# Patient Record
Sex: Male | Born: 1995 | Race: White | Hispanic: Yes | Marital: Single | Smoking: Never smoker
Health system: Southern US, Community
[De-identification: ages and names within clinical notes are randomized; demographics above are authoritative.]

## PROBLEM LIST (undated history)

## (undated) DIAGNOSIS — E669 Obesity, unspecified: Secondary | ICD-10-CM

---
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
## 2016-06-03 ENCOUNTER — Emergency Department (HOSPITAL_COMMUNITY): Payer: No Typology Code available for payment source

## 2016-06-03 ENCOUNTER — Encounter (HOSPITAL_COMMUNITY): Payer: Self-pay | Admitting: Emergency Medicine

## 2016-06-03 ENCOUNTER — Emergency Department (HOSPITAL_COMMUNITY)
Admission: EM | Admit: 2016-06-03 | Discharge: 2016-06-04 | Disposition: A | Discharge status: Home / Self Care | Payer: No Typology Code available for payment source | Attending: Emergency Medicine | Admitting: Emergency Medicine

## 2016-06-03 DIAGNOSIS — Y939 Activity, unspecified: Secondary | ICD-10-CM | POA: Insufficient documentation

## 2016-06-03 DIAGNOSIS — S9032XA Contusion of left foot, initial encounter: Secondary | ICD-10-CM | POA: Diagnosis not present

## 2016-06-03 DIAGNOSIS — Y92410 Unspecified street and highway as the place of occurrence of the external cause: Secondary | ICD-10-CM | POA: Diagnosis not present

## 2016-06-03 DIAGNOSIS — Y999 Unspecified external cause status: Secondary | ICD-10-CM | POA: Diagnosis not present

## 2016-06-03 DIAGNOSIS — S99912A Unspecified injury of left ankle, initial encounter: Secondary | ICD-10-CM | POA: Diagnosis present

## 2016-06-03 DIAGNOSIS — S93402A Sprain of unspecified ligament of left ankle, initial encounter: Secondary | ICD-10-CM | POA: Diagnosis not present
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
## 2016-06-04 ENCOUNTER — Emergency Department (HOSPITAL_COMMUNITY): Payer: No Typology Code available for payment source

## 2016-06-04 MED ORDER — NAPROXEN 500 MG PO TABS: 500.0000 mg | ORAL_TABLET | Freq: Two times a day (BID) | ORAL | 0 refills | Status: DC
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
## 2016-06-04 NOTE — ED Notes (Signed)
Pt able to ambulate independently.  Pt up to restroom.  Gait limping, but stable.

## 2016-06-04 NOTE — ED Notes (Signed)
Patient ambulatory to triage. NAD noted.

## 2017-08-01 ENCOUNTER — Emergency Department (HOSPITAL_COMMUNITY)
Admission: EM | Admit: 2017-08-01 | Discharge: 2017-08-01 | Disposition: A | Discharge status: Home / Self Care | Payer: 59 | Attending: Emergency Medicine | Admitting: Emergency Medicine

## 2017-08-01 ENCOUNTER — Encounter (HOSPITAL_COMMUNITY): Payer: Self-pay

## 2017-08-01 DIAGNOSIS — Y999 Unspecified external cause status: Secondary | ICD-10-CM | POA: Diagnosis not present

## 2017-08-01 DIAGNOSIS — Y929 Unspecified place or not applicable: Secondary | ICD-10-CM | POA: Diagnosis not present

## 2017-08-01 DIAGNOSIS — S39012A Strain of muscle, fascia and tendon of lower back, initial encounter: Secondary | ICD-10-CM | POA: Diagnosis not present

## 2017-08-01 DIAGNOSIS — Z79899 Other long term (current) drug therapy: Secondary | ICD-10-CM | POA: Insufficient documentation

## 2017-08-01 DIAGNOSIS — Y9389 Activity, other specified: Secondary | ICD-10-CM | POA: Diagnosis not present

## 2017-08-01 DIAGNOSIS — X509XXA Other and unspecified overexertion or strenuous movements or postures, initial encounter: Secondary | ICD-10-CM | POA: Insufficient documentation

## 2017-08-01 DIAGNOSIS — S3992XA Unspecified injury of lower back, initial encounter: Secondary | ICD-10-CM | POA: Diagnosis present

## 2017-08-01 LAB — URINALYSIS, ROUTINE W REFLEX MICROSCOPIC
Bilirubin Urine: NEGATIVE
GLUCOSE, UA: NEGATIVE mg/dL
HGB URINE DIPSTICK: NEGATIVE
KETONES UR: NEGATIVE mg/dL
Leukocytes, UA: NEGATIVE
Nitrite: NEGATIVE
PROTEIN: NEGATIVE mg/dL
Specific Gravity, Urine: 1.024 (ref 1.005–1.030)
pH: 7 (ref 5.0–8.0)

## 2017-08-01 LAB — CBC
HCT: 44.2 % (ref 39.0–52.0)
Hemoglobin: 14.9 g/dL (ref 13.0–17.0)
MCH: 29 pg (ref 26.0–34.0)
MCHC: 33.7 g/dL (ref 30.0–36.0)
MCV: 86 fL (ref 78.0–100.0)
PLATELETS: 216 10*3/uL (ref 150–400)
RBC: 5.14 MIL/uL (ref 4.22–5.81)
RDW: 12.6 % (ref 11.5–15.5)
WBC: 8.5 10*3/uL (ref 4.0–10.5)

## 2017-08-01 LAB — COMPREHENSIVE METABOLIC PANEL
ALK PHOS: 62 U/L (ref 38–126)
ALT: 16 U/L — AB (ref 17–63)
AST: 22 U/L (ref 15–41)
Albumin: 4 g/dL (ref 3.5–5.0)
Anion gap: 8 (ref 5–15)
BUN: 8 mg/dL (ref 6–20)
CHLORIDE: 106 mmol/L (ref 101–111)
CO2: 23 mmol/L (ref 22–32)
CREATININE: 0.79 mg/dL (ref 0.61–1.24)
Calcium: 9.2 mg/dL (ref 8.9–10.3)
GFR calc Af Amer: >60 mL/min (ref >60)
GFR calc non Af Amer: >60 mL/min (ref >60)
Glucose, Bld: 93 mg/dL (ref 65–99)
Potassium: 3.5 mmol/L (ref 3.5–5.1)
SODIUM: 137 mmol/L (ref 135–145)
Total Bilirubin: 0.5 mg/dL (ref 0.3–1.2)
Total Protein: 7.5 g/dL (ref 6.5–8.1)

## 2017-08-01 LAB — LIPASE, BLOOD: LIPASE: 19 U/L (ref 11–51)

## 2017-08-01 MED ORDER — METHOCARBAMOL 500 MG PO TABS: 500.0000 mg | ORAL_TABLET | Freq: Two times a day (BID) | ORAL | 0 refills | Status: DC

## 2017-08-01 MED ORDER — PREDNISONE 20 MG PO TABS: 40.0000 mg | ORAL_TABLET | Freq: Every day | ORAL | 0 refills | Status: DC

## 2017-08-01 MED ORDER — LIDOCAINE 5 % EX PTCH: 1.0000 | MEDICATED_PATCH | CUTANEOUS | 0 refills | Status: DC

## 2017-08-01 NOTE — ED Triage Notes (Signed)
Pt reports RLQ abdominal pain X2 days. He also reports lower back pain. Pt reports he has also had frequent nose bleeds, twice today. Pt alert and oriented. Denies n/v/d.

## 2017-08-01 NOTE — Discharge Instructions (Signed)
Take prednisone as prescribed for pain as well as Robaxin, as needed. Apply a Lidoderm patch for additional pain control as well. We advise against strenuous activity or heavy lifting for 1 week. Follow up with a sports medicine doctor to ensure resolution of your symptoms. We advise follow up with your primary care doctor as needed.

## 2017-08-01 NOTE — ED Provider Notes (Signed)
[facility] Provider Note   CSN: [id number] Arrival date & time: 08/01/17  1559     History   Chief Complaint Chief Complaint  Patient presents with  . Abdominal Pain    HPI Joseph Boone is a 21 y.o. male.  21 year old male with no significant past medical history presents to the emergency department for evaluation of right low back pain and right groin pain. He states that he has had intermittent, waxing and waning low back pain over the past few months. This was secondary to a fall. He was doing frequent heavy lifting a few days ago and has noticed more constant pain in his back since this time. 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 Pain has been unrelieved with NSAIDs. Back pain is aggravated with movement. He states that he was standing up from the toilet after having a bowel movement and noticed a pain in his right groin as well as in his back. Pain is sharp. The patient denies fever, nausea, vomiting, diarrhea, bowel/bladder incontinence, extremity numbness or paresthesias, inability to ambulate, falls or trauma. No hx of abdominal surgeries.      History reviewed. No pertinent past medical history.  Patient Active Problem List   Diagnosis Date Noted  . Transient alteration of awareness 03/28/2014  . Vasovagal syncope 03/28/2014  . Seizure-like activity (HCC) 03/28/2014  . ENCOPRESIS 12/22/2006    History reviewed. No pertinent surgical history.     Home Medications    Prior to Admission medications   Medication Sig Start Date End Date Taking? Authorizing Provider  ibuprofen (ADVIL,MOTRIN) 200 MG tablet Take 400 mg by mouth every 6 (six) hours as needed for mild pain.   Yes [provider]  naproxen (NAPROSYN) 500 MG tablet Take 1 tablet (500 mg total) by mouth 2 (two) times daily. 06/04/16  Yes [name], NP  lidocaine (LIDODERM) 5 % Place 1 patch onto the skin daily. Remove & Discard patch within 12 hours or as  directed by MD 08/01/17   Antony Madura, PA-C  methocarbamol (ROBAXIN) 500 MG tablet Take 1 tablet (500 mg total) by mouth 2 (two) times daily. 08/01/17   Antony Madura, PA-C  predniSONE (DELTASONE) 20 MG tablet Take 2 tablets (40 mg total) by mouth daily. Take 40 mg by mouth daily for 3 days, then  by mouth daily for 3 days, then  daily for 3 days 08/01/17   Antony Madura, PA-C    Family History History reviewed. No pertinent family history.  Social History Social History  Substance Use Topics  . Smoking status: Never Smoker  . Smokeless tobacco: Never Used  . Alcohol use No     Allergies   Other and Pollen extract   Review of Systems Review of Systems Ten systems reviewed and are negative for acute change, except as noted in the HPI.    Physical Exam Updated Vital Signs BP 130/83 (BP Location: Right Arm)   Pulse 78   Temp 98.3 F (36.8 C) (Oral)   Resp 14   Ht  (1.803 m)   Wt (!) 138.3 kg (305 lb)   SpO2 100%   BMI 42.54 kg/m   Physical Exam  Constitutional: He is oriented to person, place, and time. He appears well-developed and well-nourished. No distress.  Nontoxic and in NAD  HENT:  Head: Normocephalic and atraumatic.  Eyes: Conjunctivae and EOM are normal. No scleral icterus.  Neck: Normal range of motion.  Cardiovascular: Normal  rate, regular rhythm and intact distal pulses.   Pulmonary/Chest: Effort normal. No respiratory distress.  Respirations even and unlabored  Abdominal: Soft. He exhibits no mass. There is no guarding.  Soft, obese abdomen without masses or peritoneal signs. No palpable hernia on exam. No focal tenderness.  Musculoskeletal: Normal range of motion.  Positive straight leg raise and crossed straight leg raise.  Neurological: He is alert and oriented to person, place, and time. He exhibits normal muscle tone. Coordination normal.  GCS 15. Sensation to light touch intact. Patient moving all extremities.  Skin: Skin is warm and  dry. No rash noted. He is not diaphoretic. No erythema. No pallor.  Psychiatric: He has a normal mood and affect. His behavior is normal.  Nursing note and vitals reviewed.    ED Treatments / Results  Labs (all labs ordered are listed, but only abnormal results are displayed) Labs Reviewed  COMPREHENSIVE METABOLIC PANEL - Abnormal; Notable for the following:       Result Value   ALT 16 (*)    All other components within normal limits  LIPASE, BLOOD  CBC  URINALYSIS, ROUTINE W REFLEX MICROSCOPIC    EKG  EKG Interpretation None       Radiology No results found.  Procedures Procedures (including critical care time)  Medications Ordered in ED Medications - No data to display   Initial Impression / Assessment and Plan / ED Course  I have reviewed the triage vital signs and the nursing notes.  Pertinent labs & imaging results that were available during my care of the patient were reviewed by me and considered in my medical decision making (see chart for details).     21 year old male presents to the emergency department for evaluation of back pain with associated right groin pain which began today. Back pain has been chronic over the past few months, but more constant over the past few weeks. Patient neurovascularly intact. No red flags or signs concerning for cauda equina. I do not believe groin pain is secondary to abdominal etiology. Patient has no signs of acute surgical abdomen on exam. No leukocytosis or electrolyte derangements. Liver and kidney function preserved.  I do not believe further emergent workup or imaging is indicated. I have counseled the patient on supportive care and advised against heavy lifting. Patient referred to sports medicine for follow-up. Return precautions discussed and provided. Patient discharged in stable condition with no unaddressed concerns.   Final Clinical Impressions(s) / ED Diagnoses   Final diagnoses:  Low back strain, initial  encounter    New Prescriptions New Prescriptions   LIDOCAINE (LIDODERM) 5 %    Place 1 patch onto the skin daily. Remove & Discard patch within 12 hours or as directed by MD   METHOCARBAMOL (ROBAXIN) 500 MG TABLET    Take 1 tablet (500 mg total) by mouth 2 (two) times daily.   PREDNISONE (DELTASONE) 20 MG TABLET    Take 2 tablets (40 mg total) by mouth daily. Take 40 mg by mouth daily for 3 days, then  by mouth daily for 3 days, then  daily for 3 days     Antony Madura, Cordelia Poche 08/01/17 2234    Linwood Dibbles, MD 08/03/17 [phone]

## 2017-08-01 NOTE — ED Notes (Addendum)
Pt reports RLQ pain and R and L sided back pain x 2-3 days. Pt denies N/V/D. Reports he was having a BM this AM, and when he stood up the pain became much worse. Denies urinary sx. Ambulatory to room with steady gait.

## 2017-08-01 NOTE — ED Notes (Signed)
ED Provider at bedside. 

## 2017-08-31 ENCOUNTER — Emergency Department (HOSPITAL_COMMUNITY)
Admission: EM | Admit: 2017-08-31 | Discharge: 2017-09-01 | Disposition: A | Discharge status: Home / Self Care | Payer: 59 | Attending: Emergency Medicine | Admitting: Emergency Medicine

## 2017-08-31 ENCOUNTER — Other Ambulatory Visit: Payer: Self-pay

## 2017-08-31 ENCOUNTER — Encounter (HOSPITAL_COMMUNITY): Payer: Self-pay | Admitting: *Deleted

## 2017-08-31 DIAGNOSIS — J029 Acute pharyngitis, unspecified: Secondary | ICD-10-CM | POA: Insufficient documentation

## 2017-08-31 DIAGNOSIS — H9202 Otalgia, left ear: Secondary | ICD-10-CM | POA: Diagnosis present

## 2017-08-31 DIAGNOSIS — H66002 Acute suppurative otitis media without spontaneous rupture of ear drum, left ear: Secondary | ICD-10-CM | POA: Diagnosis not present

## 2017-08-31 DIAGNOSIS — Z79899 Other long term (current) drug therapy: Secondary | ICD-10-CM | POA: Insufficient documentation

## 2017-08-31 HISTORY — DX: Obesity, unspecified: E66.9

## 2017-08-31 MED ORDER — ACETAMINOPHEN 325 MG PO TABS
650.0000 mg | ORAL_TABLET | Freq: Once | ORAL | Status: AC | PRN
  Administered 2017-08-31: 650 mg via ORAL

## 2017-08-31 MED ORDER — ACETAMINOPHEN 325 MG PO TABS
ORAL_TABLET | ORAL | Status: AC
  Filled 2017-08-31: qty 2

## 2017-08-31 MED ORDER — IBUPROFEN 800 MG PO TABS
800.0000 mg | ORAL_TABLET | Freq: Once | ORAL | Status: AC
  Administered 2017-09-01: 800 mg via ORAL
  Filled 2017-08-31: qty 1

## 2017-08-31 MED ORDER — AMOXICILLIN 500 MG PO CAPS: 500.0000 mg | ORAL_CAPSULE | Freq: Three times a day (TID) | ORAL | 0 refills | Status: DC

## 2017-08-31 NOTE — ED Triage Notes (Signed)
Pt reports having left ear pain since yesterday. Feels like he has swelling down into left side of his neck and reports bloody drainage from his ear last night. Febrile at triage.

## 2017-08-31 NOTE — Discharge Instructions (Signed)
Return if any problems.

## 2017-08-31 NOTE — ED Provider Notes (Signed)
[facility]   CSN: [id number] Arrival date & time: 08/31/17  1511     History   Chief Complaint Chief Complaint  Patient presents with  . Otalgia    HPI Joseph Boone is a 21 y.o. male.  The history is provided by the patient. No language interpreter was used.  Otalgia  This is a new problem. The current episode started yesterday. There is pain in the left ear. The problem occurs constantly. The problem has been gradually worsening. The maximum temperature recorded prior to his arrival was 101 to 101.9 F. The fever has been present for 1 to 2 days. The pain is moderate. Associated symptoms include sore throat.  Pt complains of a sore throat and left ear pain  Past Medical History:  Diagnosis Date  . Obesity     Patient Active Problem List   Diagnosis Date Noted  . Transient alteration of awareness 03/28/2014  . Vasovagal syncope 03/28/2014  . Seizure-like activity (HCC) 03/28/2014  . ENCOPRESIS 12/22/2006    History reviewed. No pertinent surgical history.     Home Medications    Prior to Admission medications   Medication Sig Start Date End Date Taking? Authorizing Provider  ibuprofen (ADVIL,MOTRIN) 200 MG tablet Take 400 mg by mouth every 6 (six) hours as needed for mild pain.   Yes [provider]  methocarbamol (ROBAXIN) 500 MG tablet Take 1 tablet (500 mg total) by mouth 2 (two) times daily. 08/01/17  Yes Antony MaduraHumes, Kelly, PA-C  lidocaine (LIDODERM) 5 % Place 1 patch onto the skin daily. Remove & Discard patch within 12 hours or as directed by MD Patient not taking: Reported on 08/31/2017 08/01/17   Antony MaduraHumes, Kelly, PA-C  naproxen (NAPROSYN) 500 MG tablet Take 1 tablet (500 mg total) by mouth 2 (two) times daily. Patient not taking: Reported on 08/31/2017 06/04/16   Felicie MornSmith, David, NP  predniSONE (DELTASONE) 20 MG tablet Take 2 tablets (40 mg total) by mouth daily. Take 40 mg by mouth daily for 3 days, then  20mg  by mouth daily for 3 days, then 10mg  daily for 3 days Patient not taking: Reported on 08/31/2017 08/01/17   Antony MaduraHumes, Kelly, PA-C    Family History History reviewed. No pertinent family history.  Social History Social History   Tobacco Use  . Smoking status: Never Smoker  . Smokeless tobacco: Never Used  Substance Use Topics  . Alcohol use: No  . Drug use: No     Allergies   Other; Pollen extract; and Tylenol [acetaminophen]   Review of Systems Review of Systems  HENT: Positive for ear pain and sore throat.   All other systems reviewed and are negative.    Physical Exam Updated Vital Signs BP 127/85 (BP Location: Right Arm)   Pulse (!) 102   Temp 99.4 F (37.4 C) (Oral)   Resp 18   SpO2 100%   Physical Exam  Constitutional: He appears well-developed and well-nourished.  HENT:  Head: Normocephalic and atraumatic.  Nose: Nose normal.  Mouth/Throat: Oropharynx is clear and moist.  Left tm erythematous,  Throat erythematous   Eyes: Conjunctivae are normal.  Neck: Neck supple.  Cardiovascular: Normal rate and regular rhythm.  No murmur heard. Pulmonary/Chest: Effort normal and breath sounds normal. No respiratory distress.  Abdominal: Soft. There is no tenderness.  Musculoskeletal: He exhibits no edema.  Neurological: He is alert.  Skin: Skin is warm and dry.  Psychiatric: He has a normal mood and affect.  Nursing note and vitals reviewed.    ED Treatments / Results  Labs (all labs ordered are listed, but only abnormal results are displayed) Labs Reviewed - No data to display  EKG  EKG Interpretation None       Radiology No results found.  Procedures Procedures (including critical care time)  Medications Ordered in ED Medications  acetaminophen (TYLENOL) 325 MG tablet (not administered)  acetaminophen (TYLENOL) tablet 650 mg (650 mg Oral Given 08/31/17 1548)     Initial Impression / Assessment and Plan / ED Course  I have reviewed the  triage vital signs and the nursing notes.  Pertinent labs & imaging results that were available during my care of the patient were reviewed by me and considered in my medical decision making (see chart for details).       Final Clinical Impressions(s) / ED Diagnoses   Final diagnoses:  Acute suppurative otitis media of left ear without spontaneous rupture of tympanic membrane, recurrence not specified  Pharyngitis, unspecified etiology    ED Discharge Orders    None    An After Visit Summary was printed and given to the patient.    Elson AreasSofia, Makina Skow K, PA-C 08/31/17 2320    Donnetta Hutchingook, Brian, MD 09/02/17 1343

## 2017-09-01 NOTE — ED Notes (Signed)
Pt verbalized understanding of discharge paperwork.

## 2020-03-12 ENCOUNTER — Ambulatory Visit: Payer: Self-pay | Admitting: Adult Health

## 2020-05-28 ENCOUNTER — Other Ambulatory Visit: Payer: Self-pay

## 2020-05-28 ENCOUNTER — Ambulatory Visit: Payer: 59 | Admitting: Adult Health

## 2020-08-14 DIAGNOSIS — R079 Chest pain, unspecified: Secondary | ICD-10-CM | POA: Diagnosis not present

## 2021-06-18 ENCOUNTER — Other Ambulatory Visit: Payer: Self-pay

## 2021-06-18 ENCOUNTER — Emergency Department (HOSPITAL_COMMUNITY)
Admission: EM | Admit: 2021-06-18 | Discharge: 2021-06-18 | Disposition: A | Discharge status: Home / Self Care | Payer: Medicaid Other | Attending: Emergency Medicine | Admitting: Emergency Medicine

## 2021-06-18 DIAGNOSIS — Y9389 Activity, other specified: Secondary | ICD-10-CM | POA: Insufficient documentation

## 2021-06-18 DIAGNOSIS — S60943A Unspecified superficial injury of left middle finger, initial encounter: Secondary | ICD-10-CM | POA: Diagnosis present

## 2021-06-18 DIAGNOSIS — Y9289 Other specified places as the place of occurrence of the external cause: Secondary | ICD-10-CM | POA: Diagnosis not present

## 2021-06-18 DIAGNOSIS — S61213A Laceration without foreign body of left middle finger without damage to nail, initial encounter: Secondary | ICD-10-CM | POA: Diagnosis not present

## 2021-06-18 DIAGNOSIS — Z23 Encounter for immunization: Secondary | ICD-10-CM | POA: Diagnosis not present

## 2021-06-18 DIAGNOSIS — W268XXA Contact with other sharp object(s), not elsewhere classified, initial encounter: Secondary | ICD-10-CM | POA: Diagnosis not present

## 2021-06-18 MED ORDER — TETANUS-DIPHTH-ACELL PERTUSSIS 5-2.5-18.5 LF-MCG/0.5 IM SUSY
0.5000 mL | PREFILLED_SYRINGE | Freq: Once | INTRAMUSCULAR | Status: AC
  Administered 2021-06-18: 0.5 mL via INTRAMUSCULAR
  Filled 2021-06-18: qty 0.5

## 2021-06-18 NOTE — ED Provider Notes (Signed)
Emergency Medicine Provider Triage Evaluation Note  Thadius Smisek , a 25 y.o. male  was evaluated in triage.  Pt complains of finger lac.  Review of Systems  Positive: Lac to L middle finger Negative: numbness  Physical Exam  BP 125/81 (BP Location: Right Arm)   Pulse (!) 111   Temp 98.1 F (36.7 C) (Oral)   Resp 18   SpO2 96%  Gen:   Awake, no distress   Resp:  Normal effort  MSK:   Moves extremities without difficulty  Other:  L middle finger: avulsion of skin to medial distal digit, actively bleeding, minimal nail involvement, no joint involvement.   Medical Decision Making  Medically screening exam initiated at 7:12 PM.  Appropriate orders placed.  Hayward Rylander was informed that the remainder of the evaluation will be completed by another provider, this initial triage assessment does not replace that evaluation, and the importance of remaining in the ED until their evaluation is complete.  Pt was cutting guacamole at work when he accidentally sliced a portion of the skin in his left middle finger.  Did tried cleaning it with hydrogen peroxide and use liquid bandaid but unable to provide adequate hemostasis.  No numbness. Does not recall last tdap.   Fayrene Helper, PA-C 06/18/21 1914    Koleen Distance, MD 06/18/21 [phone]

## 2021-06-18 NOTE — ED Provider Notes (Signed)
[facility] EMERGENCY DEPARTMENT Provider Note   CSN: [id number] Arrival date & time: 06/18/21  1903     History Chief Complaint  Patient presents with   Laceration    R middle    Joseph Boone is a 25 y.o. male.  Left-handed.  The history is provided by the patient.  Laceration Location:  Finger Finger laceration location:  L middle finger Length:  1 cm Depth:  Cutaneous Quality: avulsion   Bleeding: controlled   Time since incident:  2 hours Laceration mechanism:  Knife (cutting jalapeno at restaurant) Pain details:    Quality:  Throbbing   Severity:  Mild   Timing:  Constant   Progression:  Unchanged Foreign body present:  No foreign bodies Relieved by:  Pressure Worsened by:  Nothing Ineffective treatments:  None tried Tetanus status:  Out of date Associated symptoms: no fever, no focal weakness, no numbness and no rash       Past Medical History:  Diagnosis Date   Obesity     Patient Active Problem List   Diagnosis Date Noted   Transient alteration of awareness 03/28/2014   Vasovagal syncope 03/28/2014   Seizure-like activity (HCC) 03/28/2014   ENCOPRESIS 12/22/2006    No past surgical history on file.     No family history on file.  Social History   Tobacco Use   Smoking status: Never   Smokeless tobacco: Never  Substance Use Topics   Alcohol use: No   Drug use: No    Home Medications Prior to Admission medications   Medication Sig Start Date End Date Taking? Authorizing Provider  amoxicillin (AMOXIL) 500 MG capsule Take 1 capsule (500 mg total) 3 (three) times daily by mouth. 08/31/17   Elson Areas, PA-C  ibuprofen (ADVIL,MOTRIN) 200 MG tablet Take 400 mg by mouth every 6 (six) hours as needed for mild pain.    [provider]  lidocaine (LIDODERM) 5 % Place 1 patch onto the skin daily. Remove & Discard patch within 12 hours or as directed by MD Patient not taking: Reported on 08/31/2017 08/01/17    Antony Madura, PA-C  methocarbamol (ROBAXIN) 500 MG tablet Take 1 tablet (500 mg total) by mouth 2 (two) times daily. 08/01/17   Antony Madura, PA-C  naproxen (NAPROSYN) 500 MG tablet Take 1 tablet (500 mg total) by mouth 2 (two) times daily. Patient not taking: Reported on 08/31/2017 06/04/16   [name], NP  predniSONE (DELTASONE) 20 MG tablet Take 2 tablets (40 mg total) by mouth daily. Take 40 mg by mouth daily for 3 days, then 20mg  by mouth daily for 3 days, then 10mg  daily for 3 days Patient not taking: Reported on 08/31/2017 08/01/17   13/04/2017, PA-C    Allergies    Other, Pollen extract, and Tylenol [acetaminophen]  Review of Systems   Review of Systems  Constitutional:  Negative for chills and fever.  HENT:  Negative for ear pain and sore throat.   Eyes:  Negative for pain and visual disturbance.  Respiratory:  Negative for cough and shortness of breath.   Cardiovascular:  Negative for chest pain and palpitations.  Gastrointestinal:  Negative for abdominal pain and vomiting.  Genitourinary:  Negative for dysuria and hematuria.  Musculoskeletal:  Negative for arthralgias and back pain.  Skin:  Positive for wound. Negative for color change and rash.  Neurological:  Negative for focal weakness, seizures and syncope.  All other systems reviewed and are negative.  Physical Exam  Updated Vital Signs BP 125/81 (BP Location: Right Arm)   Pulse (!) 111   Temp 98.1 F (36.7 C) (Oral)   Resp 18   SpO2 96%   Physical Exam Vitals and nursing note reviewed.  Constitutional:      Appearance: Normal appearance.  HENT:     Head: Normocephalic and atraumatic.  Eyes:     Conjunctiva/sclera: Conjunctivae normal.  Pulmonary:     Effort: Pulmonary effort is normal. No respiratory distress.  Musculoskeletal:        General: No deformity. Normal range of motion.     Cervical back: Normal range of motion.  Skin:    General: Skin is warm and dry.     Comments: There is a jagged  avulsion on the radial aspect of the distal phalanx of the left third finger.  The wound was hemostatic at the moment.  No decrease in sensation to light touch.  Capillary refill is normal.  Tendon function is normal.  Neurological:     General: No focal deficit present.     Mental Status: He is alert and oriented to person, place, and time. Mental status is at baseline.  Psychiatric:        Mood and Affect: Mood normal.    ED Results / Procedures / Treatments   Labs (all labs ordered are listed, but only abnormal results are displayed) Labs Reviewed - No data to display  EKG None  Radiology No results found.  Procedures Procedures   Medications Ordered in ED Medications  Tdap (BOOSTRIX) injection 0.5 mL (has no administration in time range)    ED Course  I have reviewed the triage vital signs and the nursing notes.  Pertinent labs & imaging results that were available during my care of the patient were reviewed by me and considered in my medical decision making (see chart for details).    MDM Rules/Calculators/A&P                           Joseph Boone presents after lacerating a finger while cutting vegetables.  The wound is hemostatic.  It is not amenable to suture repair given its avulsion characteristic.  He was given wound care instructions as well as a tetanus injection. Final Clinical Impression(s) / ED Diagnoses Final diagnoses:  Laceration of left middle finger without foreign body without damage to nail, initial encounter    Rx / DC Orders ED Discharge Orders     None        Koleen Distance, MD 06/18/21 2126

## 2021-06-18 NOTE — ED Triage Notes (Signed)
Pt cut L middle finger while cutting food, unsure of last tetanus shot. Hydrogen peroxide & liquid bandaid on cut PTA. Bleeding controlled in triage, fresh bandage applied

## 2021-08-20 ENCOUNTER — Other Ambulatory Visit: Payer: Self-pay

## 2021-08-20 ENCOUNTER — Emergency Department (HOSPITAL_COMMUNITY): Payer: Medicaid Other

## 2021-08-20 ENCOUNTER — Emergency Department (HOSPITAL_COMMUNITY)
Admission: EM | Admit: 2021-08-20 | Discharge: 2021-08-21 | Disposition: A | Discharge status: Home / Self Care | Payer: Medicaid Other | Attending: Emergency Medicine | Admitting: Emergency Medicine

## 2021-08-20 ENCOUNTER — Encounter (HOSPITAL_COMMUNITY): Payer: Self-pay

## 2021-08-20 DIAGNOSIS — R059 Cough, unspecified: Secondary | ICD-10-CM | POA: Diagnosis present

## 2021-08-20 DIAGNOSIS — Z20822 Contact with and (suspected) exposure to covid-19: Secondary | ICD-10-CM | POA: Diagnosis not present

## 2021-08-20 DIAGNOSIS — J101 Influenza due to other identified influenza virus with other respiratory manifestations: Secondary | ICD-10-CM | POA: Insufficient documentation

## 2021-08-20 LAB — BASIC METABOLIC PANEL
Anion gap: 9 (ref 5–15)
BUN: 11 mg/dL (ref 6–20)
CO2: 24 mmol/L (ref 22–32)
Calcium: 9.1 mg/dL (ref 8.9–10.3)
Chloride: 102 mmol/L (ref 98–111)
Creatinine, Ser: 1.06 mg/dL (ref 0.61–1.24)
GFR, Estimated: >60 mL/min (ref >60)
Glucose, Bld: 91 mg/dL (ref 70–99)
Potassium: 3.4 mmol/L — ABNORMAL LOW (ref 3.5–5.1)
Sodium: 135 mmol/L (ref 135–145)

## 2021-08-20 LAB — CBC WITH DIFFERENTIAL/PLATELET
Abs Immature Granulocytes: 0.03 10*3/uL (ref 0.00–0.07)
Basophils Absolute: 0 10*3/uL (ref 0.0–0.1)
Basophils Relative: 0 %
Eosinophils Absolute: 0 10*3/uL (ref 0.0–0.5)
Eosinophils Relative: 0 %
HCT: 47.8 % (ref 39.0–52.0)
Hemoglobin: 16.2 g/dL (ref 13.0–17.0)
Immature Granulocytes: 0 %
Lymphocytes Relative: 20 %
Lymphs Abs: 1.4 10*3/uL (ref 0.7–4.0)
MCH: 29 pg (ref 26.0–34.0)
MCHC: 33.9 g/dL (ref 30.0–36.0)
MCV: 85.7 fL (ref 80.0–100.0)
Monocytes Absolute: 0.9 10*3/uL (ref 0.1–1.0)
Monocytes Relative: 13 %
Neutro Abs: 4.7 10*3/uL (ref 1.7–7.7)
Neutrophils Relative %: 67 %
Platelets: 207 10*3/uL (ref 150–400)
RBC: 5.58 MIL/uL (ref 4.22–5.81)
RDW: 12.4 % (ref 11.5–15.5)
WBC: 7.1 10*3/uL (ref 4.0–10.5)
nRBC: 0 % (ref 0.0–0.2)

## 2021-08-20 LAB — RESP PANEL BY RT-PCR (FLU A&B, COVID) ARPGX2
Influenza A by PCR: POSITIVE — AB
Influenza B by PCR: NEGATIVE
SARS Coronavirus 2 by RT PCR: NEGATIVE

## 2021-08-20 LAB — TROPONIN I (HIGH SENSITIVITY)
Troponin I (High Sensitivity): <2 ng/L (ref <18)
Troponin I (High Sensitivity): <2 ng/L (ref <18)

## 2021-08-20 NOTE — ED Triage Notes (Signed)
Pt here POV with c/o of cold symptoms. Body aches, cold sweats, cough, fever at home.

## 2021-08-20 NOTE — ED Provider Notes (Signed)
Emergency Medicine Provider Triage Evaluation Note  Joseph Boone , Boone 25 y.o. male  was evaluated in triage.  Pt complains of cough, chest pain.  Chest pain no exertional nature.  He has had some myalgias, fever at home.  He wants make sure he does not COVID.  He previously had COVID need to be admission and told he had "inflammation and enlarged heart."  States he needs to be on medication for this.  Denies any prior history of PE or DVT.  Usually has shortness of breath however none currently.  No lower extremity edema.  Review of Systems  Positive: Chest pain, cough Negative: Fever, emesis  Physical Exam  BP 126/87 (BP Location: Left Arm)   Pulse 87   Temp 98.9 F (37.2 C) (Oral)   Resp 18   SpO2 99%  Gen:   Awake, no distress   Resp:  Normal effort  MSK:   Moves extremities without difficulty  Other:    Medical Decision Making  Medically screening exam initiated at 5:44 PM.  Appropriate orders placed.  Joseph Boone was informed that the remainder of the evaluation will be completed by another provider, this initial triage assessment does not replace that evaluation, and the importance of remaining in the ED until their evaluation is complete.  URI and CP   Joseph Laiche A, PA-C 08/20/21 1745    Joseph Bucco, MD 08/20/21 6333

## 2021-08-21 NOTE — Discharge Instructions (Signed)
You were evaluated in the Emergency Department and after careful evaluation, we did not find any emergent condition requiring admission or further testing in the hospital.  Your exam/testing today was overall reassuring.  Symptoms seem to be due to the flu.  Recommend plenty of fluids at home, Tylenol or Motrin for discomfort.  Should be feeling better in a few days.  Please return to the Emergency Department if you experience any worsening of your condition.  Thank you for allowing [place] to be a part of your care.

## 2021-08-21 NOTE — ED Provider Notes (Signed)
[facility]EMERGENCY DEPT [facility] Emergency Department Provider Note MRN:  [id number]  Arrival date & time: 08/21/21     Chief Complaint   URI History of Present Illness   Joseph Boone is a 25 y.o. year-old male with a history of obesity presenting to the ED with chief complaint of URI.  Cough, nasal congestion, mild sore throat.  Chest discomfort during cough occasionally.  Symptoms for the past 2 or 3 days.  Denies shortness of breath, no abdominal pain, no nausea vomiting or diarrhea.  No syncopal episodes.  Symptoms are constant, mild to moderate, no exacerbating or alleviating factors.  Review of Systems  A complete 10 system review of systems was obtained and all systems are negative except as noted in the HPI and PMH.   Patient's Health History    Past Medical History:  Diagnosis Date   Obesity     History reviewed. No pertinent surgical history.  History reviewed. No pertinent family history.  Social History   Socioeconomic History   Marital status: Single    Spouse name: Not on file   Number of children: Not on file   Years of education: Not on file   Highest education level: Not on file  Occupational History   Not on file  Tobacco Use   Smoking status: Never   Smokeless tobacco: Never  Substance and Sexual Activity   Alcohol use: No   Drug use: No   Sexual activity: Never    Birth control/protection: Abstinence  Other Topics Concern   Not on file  Social History Narrative   Not on file   Social Determinants of Health   Financial Resource Strain: Not on file  Food Insecurity: Not on file  Transportation Needs: Not on file  Physical Activity: Not on file  Stress: Not on file  Social Connections: Not on file  Intimate Partner Violence: Not on file     Physical Exam   Vitals:   08/21/21 0245 08/21/21 0348  BP: 117/70 109/74  Pulse: 94 83  Resp: 20 15  Temp: 100 F (37.8 C) (!) 97.1 F (36.2 C)  SpO2: 99% 98%    CONSTITUTIONAL:  Well-appearing, NAD NEURO:  Alert and oriented x 3, no focal deficits EYES:  eyes equal and reactive ENT/NECK:  no LAD, no JVD CARDIO: Regular rate, well-perfused, normal S1 and S2 PULM:  CTAB no wheezing or rhonchi GI/GU:  normal bowel sounds, non-distended, non-tender MSK/SPINE:  No gross deformities, no edema SKIN:  no rash, atraumatic PSYCH:  Appropriate speech and behavior  *Additional and/or pertinent findings included in MDM below  Diagnostic and Interventional Summary    EKG Interpretation  Date/Time:  Friday August 21 2021 03:46:46 EDT Ventricular Rate:  87 PR Interval:  152 QRS Duration: 105 QT Interval:  388 QTC Calculation: 467 R Axis:   87 Text Interpretation: Sinus rhythm Confirmed by Kennis Carina [id number]) on 08/21/2021 3:52:18 AM       Labs Reviewed  RESP PANEL BY RT-PCR (FLU A&B, COVID) ARPGX2 - Abnormal; Notable for the following components:      Result Value   Influenza A by PCR POSITIVE (*)    All other components within normal limits  BASIC METABOLIC PANEL - Abnormal; Notable for the following components:   Potassium 3.4 (*)    All other components within normal limits  CBC WITH DIFFERENTIAL/PLATELET  TROPONIN I (HIGH SENSITIVITY)  TROPONIN I (HIGH SENSITIVITY)    DG Chest 2 View  Final Result  Medications - No data to display   Procedures  /  Critical Care Procedures  ED Course and Medical Decision Making  I have reviewed the triage vital signs, the nursing notes, and pertinent available records from the EMR.  Listed above are laboratory and imaging tests that I personally ordered, reviewed, and interpreted and then considered in my medical decision making (see below for details).  URI symptoms well explained by flu.  Tested positive for influenza A.  No increased work of breathing, vital signs reassuring, appropriate for discharge with symptomatic management at home.       Elmer Sow. Pilar Plate, MD [facility] Emergency Medicine [facility] [email or web address]  Final Clinical Impressions(s) / ED Diagnoses     ICD-10-CM   1. Influenza A  J10.1       ED Discharge Orders     None        Discharge Instructions Discussed with and Provided to Patient:    Discharge Instructions      You were evaluated in the Emergency Department and after careful evaluation, we did not find any emergent condition requiring admission or further testing in the hospital.  Your exam/testing today was overall reassuring.  Symptoms seem to be due to the flu.  Recommend plenty of fluids at home, Tylenol or Motrin for discomfort.  Should be feeling better in a few days.  Please return to the Emergency Department if you experience any worsening of your condition.  Thank you for allowing [place] to be a part of your care.        Sabas Sous, MD 08/21/21 [phone]

## 2022-04-05 IMAGING — CR DG CHEST 2V
2 series · 2 of 2 positions shown · non-contrast
Comparison: 01/26/2021

CLINICAL DATA: Chest pain cough

EXAM:
CHEST - 2 VIEW

[chest lat]
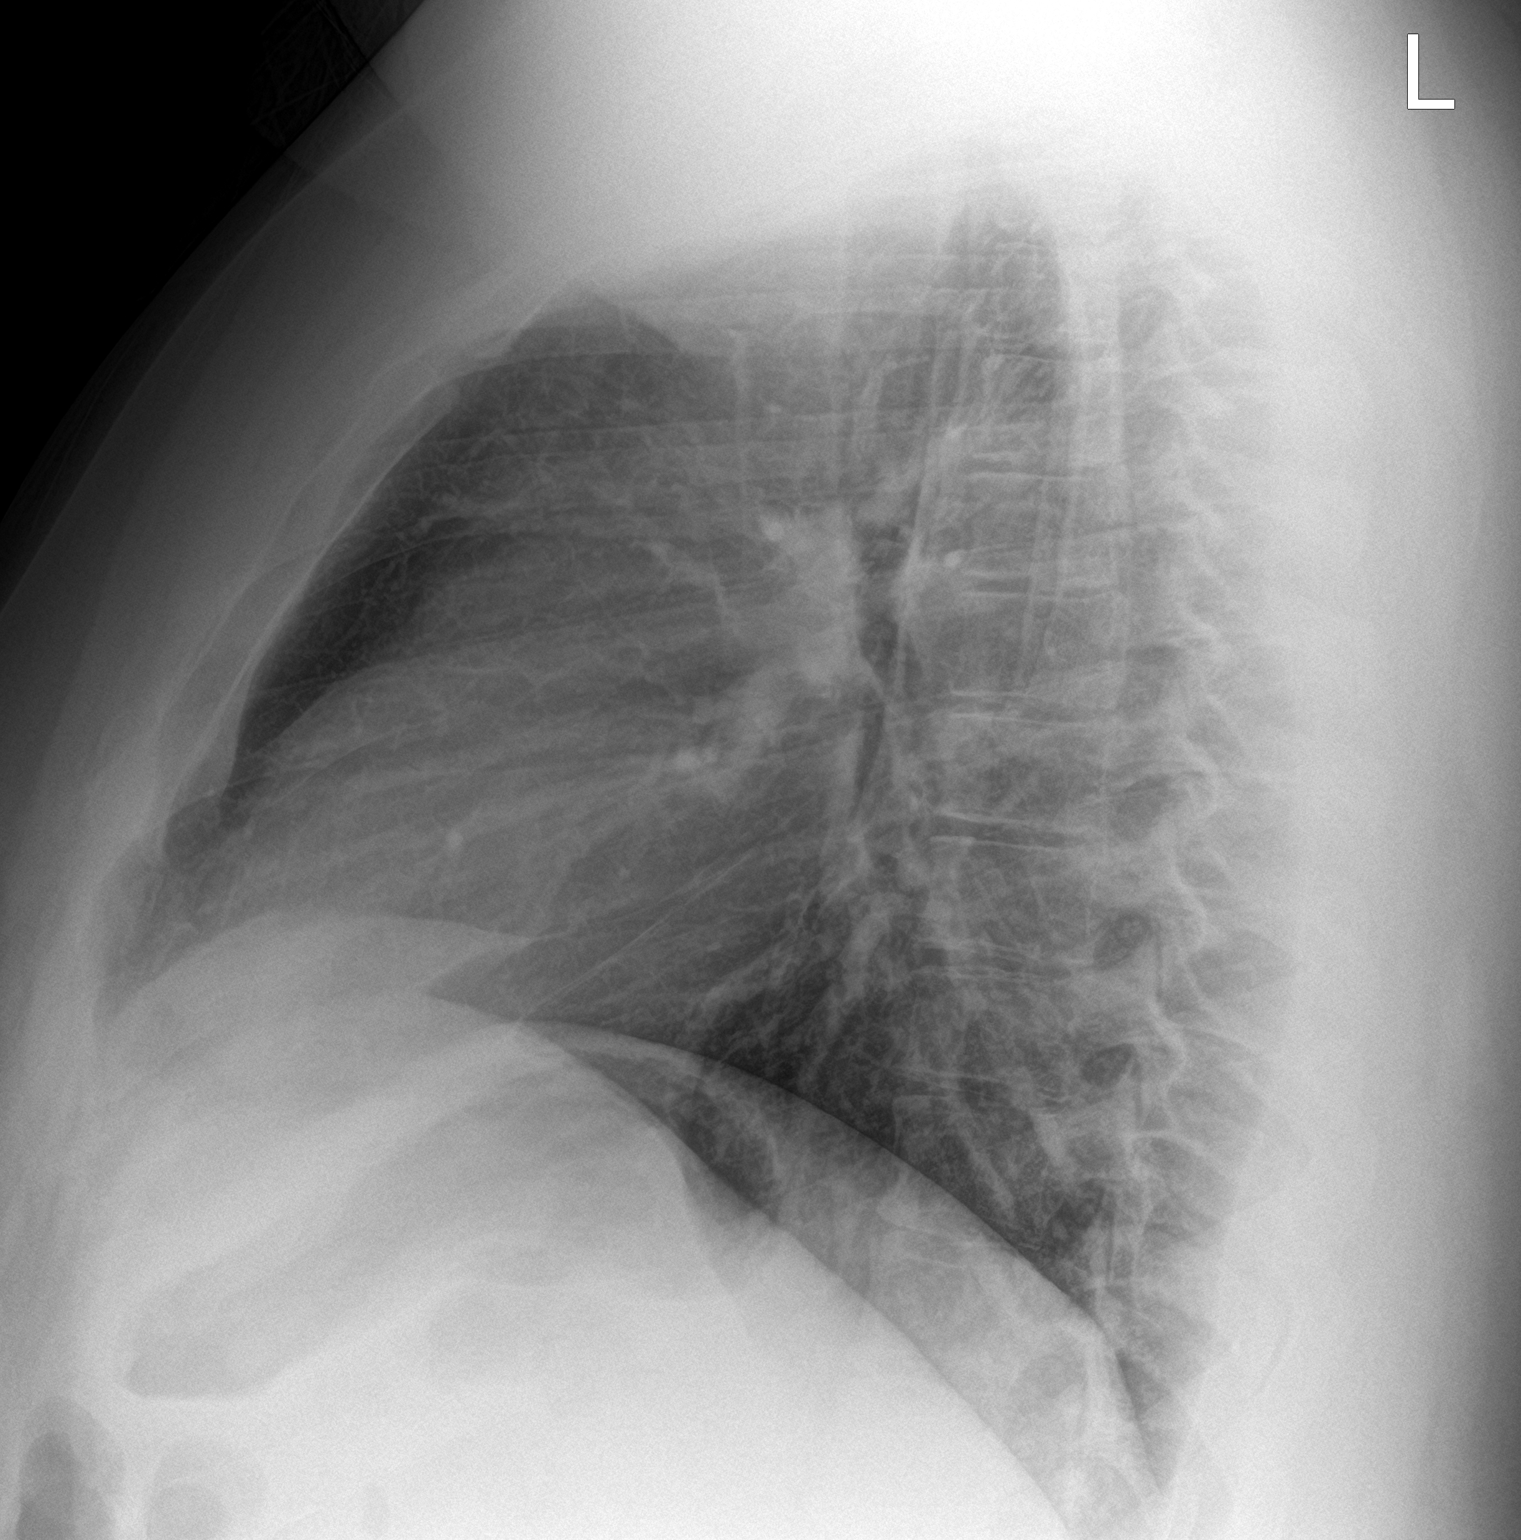

[chest pa]
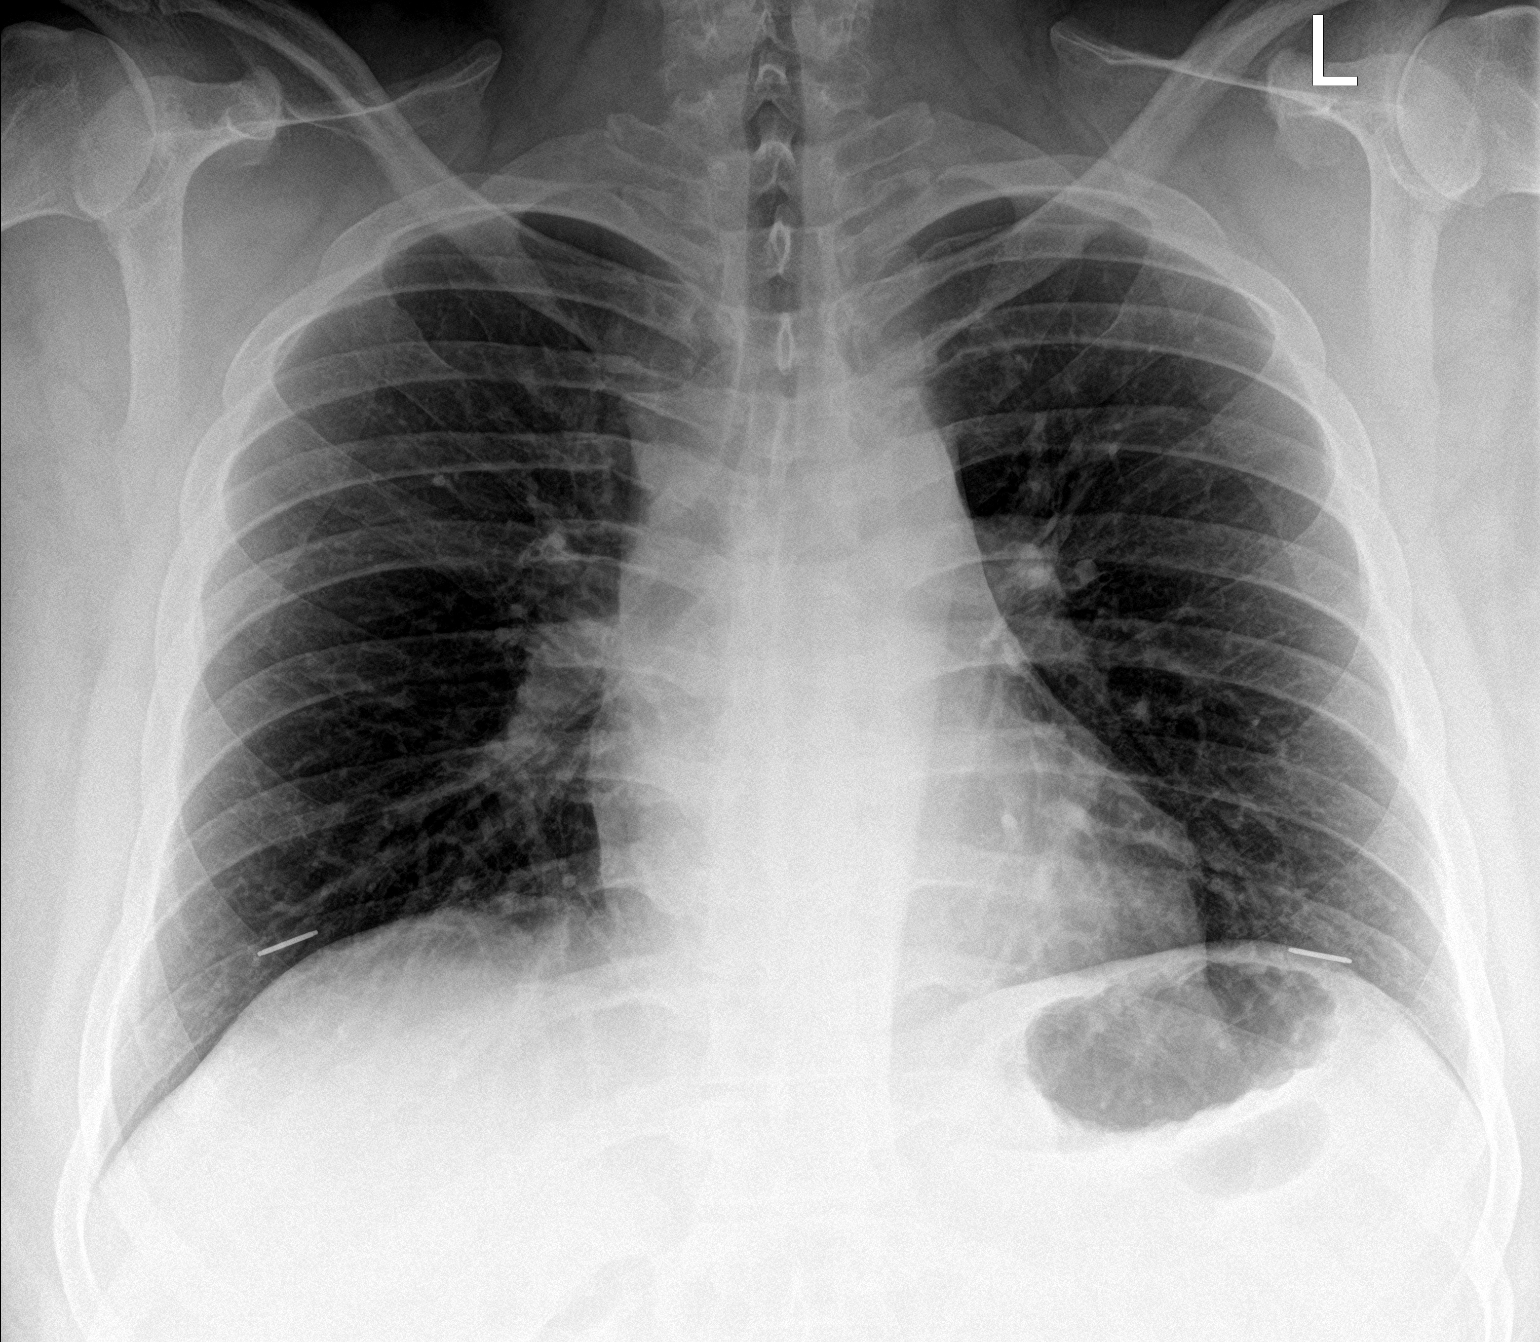

[2 of 2 positions shown; findings below may reference images not displayed]

FINDINGS: The heart size and mediastinal contours are within normal limits.
Both lungs are clear. The visualized skeletal structures are
unremarkable. Linear metallic densities over the lower chest are
presumably within the nipples.
IMPRESSION: No active cardiopulmonary disease.

## 2022-10-31 ENCOUNTER — Emergency Department (HOSPITAL_COMMUNITY)
Admission: EM | Admit: 2022-10-31 | Discharge: 2022-11-01 | Disposition: A | Discharge status: Home / Self Care | Payer: 59 | Attending: Emergency Medicine | Admitting: Emergency Medicine

## 2022-10-31 ENCOUNTER — Emergency Department (HOSPITAL_COMMUNITY): Payer: 59

## 2022-10-31 ENCOUNTER — Other Ambulatory Visit: Payer: Self-pay

## 2022-10-31 DIAGNOSIS — U071 COVID-19: Secondary | ICD-10-CM

## 2022-10-31 DIAGNOSIS — R0602 Shortness of breath: Secondary | ICD-10-CM | POA: Diagnosis present

## 2022-10-31 LAB — URINALYSIS, ROUTINE W REFLEX MICROSCOPIC
Bilirubin Urine: NEGATIVE
Glucose, UA: NEGATIVE mg/dL
Hgb urine dipstick: NEGATIVE
Ketones, ur: NEGATIVE mg/dL
Leukocytes,Ua: NEGATIVE
Nitrite: NEGATIVE
Protein, ur: NEGATIVE mg/dL
Specific Gravity, Urine: 1.023 (ref 1.005–1.030)
pH: 5 (ref 5.0–8.0)

## 2022-10-31 LAB — CBC
HCT: 47 % (ref 39.0–52.0)
Hemoglobin: 15.7 g/dL (ref 13.0–17.0)
MCH: 29.2 pg (ref 26.0–34.0)
MCHC: 33.4 g/dL (ref 30.0–36.0)
MCV: 87.4 fL (ref 80.0–100.0)
Platelets: 204 10*3/uL (ref 150–400)
RBC: 5.38 MIL/uL (ref 4.22–5.81)
RDW: 12.6 % (ref 11.5–15.5)
WBC: 6.3 10*3/uL (ref 4.0–10.5)
nRBC: 0 % (ref 0.0–0.2)

## 2022-10-31 NOTE — ED Provider Triage Note (Signed)
Emergency Medicine Provider Triage Evaluation Note  Joseph Boone , a 27 y.o. male  was evaluated in triage.  Pt complains of some blurry vision (no floaters or field cuts) feels fatigued, cough/congestion.   No fevers.  States he's urinating frequently -- mother has DM but he's never been tested.   Review of Systems  Positive: above Negative: fevr  Physical Exam  BP (!) 144/83 (BP Location: Right Arm)   Pulse 86   Temp 99.3 F (37.4 C)   Resp 18   SpO2 99%  Gen:   Awake, no distress  Resp:  Normal effort  MSK:   Moves extremities without difficulty  Other:    Medical Decision Making  Medically screening exam initiated at 11:04 PM.  Appropriate orders placed.  Joseph Boone was informed that the remainder of the evaluation will be completed by another provider, this initial triage assessment does not replace that evaluation, and the importance of remaining in the ED until their evaluation is complete.  Labs, covid/flu/cxr   Pati Gallo Wilber, [place] 10/31/22 2306

## 2022-10-31 NOTE — ED Triage Notes (Signed)
Patient reports SOB with productive cough and blurred vision onset 2 days ago .

## 2022-11-01 DIAGNOSIS — U071 COVID-19: Secondary | ICD-10-CM | POA: Diagnosis not present

## 2022-11-01 LAB — BASIC METABOLIC PANEL
Anion gap: 7 (ref 5–15)
BUN: 10 mg/dL (ref 6–20)
CO2: 25 mmol/L (ref 22–32)
Calcium: 8.7 mg/dL — ABNORMAL LOW (ref 8.9–10.3)
Chloride: 103 mmol/L (ref 98–111)
Creatinine, Ser: 0.97 mg/dL (ref 0.61–1.24)
GFR, Estimated: >60 mL/min (ref >60)
Glucose, Bld: 95 mg/dL (ref 70–99)
Potassium: 3.4 mmol/L — ABNORMAL LOW (ref 3.5–5.1)
Sodium: 135 mmol/L (ref 135–145)

## 2022-11-01 LAB — RESP PANEL BY RT-PCR (RSV, FLU A&B, COVID)  RVPGX2
Influenza A by PCR: NEGATIVE
Influenza B by PCR: NEGATIVE
Resp Syncytial Virus by PCR: NEGATIVE
SARS Coronavirus 2 by RT PCR: POSITIVE — AB

## 2022-11-01 MED ORDER — ALBUTEROL SULFATE HFA 108 (90 BASE) MCG/ACT IN AERS
2.0000 | INHALATION_SPRAY | Freq: Once | RESPIRATORY_TRACT | Status: AC
  Administered 2022-11-01: 2 via RESPIRATORY_TRACT

## 2022-11-01 NOTE — ED Provider Notes (Signed)
[facility] EMERGENCY DEPARTMENT Provider Note   CSN: [id number] Arrival date & time: 10/31/22  2251     History  Chief Complaint  Patient presents with   SOB / Cough    Covid+    Joseph Boone is a 27 y.o. male.  Patient here with cough and congestion for the last few hours.  Nothing makes it worse or better.  No significant medical history.  Denies any nausea vomiting diarrhea.  Denies any chest pain or shortness of breath.  Has had a cough.  The history is provided by the patient.       Home Medications Prior to Admission medications   Medication Sig Start Date End Date Taking? Authorizing Provider  amoxicillin (AMOXIL) 500 MG capsule Take 1 capsule (500 mg total) 3 (three) times daily by mouth. 08/31/17   Elson Areas, PA-C  ibuprofen (ADVIL,MOTRIN) 200 MG tablet Take 400 mg by mouth every 6 (six) hours as needed for mild pain.    [provider]  lidocaine (LIDODERM) 5 % Place 1 patch onto the skin daily. Remove & Discard patch within 12 hours or as directed by MD Patient not taking: Reported on 08/31/2017 08/01/17   Antony Madura, PA-C  methocarbamol (ROBAXIN) 500 MG tablet Take 1 tablet (500 mg total) by mouth 2 (two) times daily. 08/01/17   Antony Madura, PA-C  naproxen (NAPROSYN) 500 MG tablet Take 1 tablet (500 mg total) by mouth 2 (two) times daily. Patient not taking: Reported on 08/31/2017 06/04/16   [name], NP  predniSONE (DELTASONE) 20 MG tablet Take 2 tablets (40 mg total) by mouth daily. Take 40 mg by mouth daily for 3 days, then 20mg  by mouth daily for 3 days, then 10mg  daily for 3 days Patient not taking: Reported on 08/31/2017 08/01/17   13/04/2017, PA-C      Allergies    Other, Pollen extract, and Tylenol [acetaminophen]    Review of Systems   Review of Systems  Physical Exam Updated Vital Signs BP 128/79 (BP Location: Right Arm)   Pulse 98   Temp 99.3 F (37.4 C)   Resp 18   SpO2 100%  Physical Exam Vitals and  nursing note reviewed.  Constitutional:      General: He is not in acute distress.    Appearance: He is well-developed.  HENT:     Head: Normocephalic and atraumatic.     Mouth/Throat:     Mouth: Mucous membranes are moist.  Eyes:     Conjunctiva/sclera: Conjunctivae normal.     Pupils: Pupils are equal, round, and reactive to light.  Cardiovascular:     Rate and Rhythm: Normal rate and regular rhythm.     Heart sounds: No murmur heard. Pulmonary:     Effort: Pulmonary effort is normal. No respiratory distress.     Breath sounds: Normal breath sounds.  Abdominal:     Palpations: Abdomen is soft.     Tenderness: There is no abdominal tenderness.  Musculoskeletal:        General: No swelling.     Cervical back: Neck supple.  Skin:    General: Skin is warm and dry.     Capillary Refill: Capillary refill takes less than 2 seconds.  Neurological:     Mental Status: He is alert.  Psychiatric:        Mood and Affect: Mood normal.     ED Results / Procedures / Treatments   Labs (all labs ordered are  listed, but only abnormal results are displayed) Labs Reviewed  RESP PANEL BY RT-PCR (RSV, FLU A&B, COVID)  RVPGX2 - Abnormal; Notable for the following components:      Result Value   SARS Coronavirus 2 by RT PCR POSITIVE (*)    All other components within normal limits  BASIC METABOLIC PANEL - Abnormal; Notable for the following components:   Potassium 3.4 (*)    Calcium 8.7 (*)    All other components within normal limits  CBC  URINALYSIS, ROUTINE W REFLEX MICROSCOPIC    EKG EKG Interpretation  Date/Time:  Sunday October 31 2022 22:53:02 EST Ventricular Rate:  92 PR Interval:  146 QRS Duration: 98 QT Interval:  354 QTC Calculation: 437 R Axis:   67 Text Interpretation: Normal sinus rhythm Possible Lateral infarct , age undetermined Abnormal ECG When compared with ECG of 21-Aug-2021 03:46, No significant change was found Confirmed by Delora Fuel ([id number]) on 10/31/2022  11:59:08 PM  Radiology DG Chest 2 View  Result Date: 10/31/2022 CLINICAL DATA:  Cough and fatigue stepped chest x-ray 08/20/2021 EXAM: CHEST - 2 VIEW COMPARISON:  None Available. FINDINGS: The heart size and mediastinal contours are within normal limits. Both lungs are clear. The visualized skeletal structures are unremarkable. IMPRESSION: No active cardiopulmonary disease. Electronically Signed   By: Ronney Asters M.D.   On: 10/31/2022 23:32    Procedures Procedures    Medications Ordered in ED Medications  albuterol (VENTOLIN HFA) 108 (90 Base) MCG/ACT inhaler 2 puff (has no administration in time range)    ED Course/ Medical Decision Making/ A&P                           Medical Decision Making Risk Prescription drug management.   Joseph Boone is here with flulike symptoms.  Well-appearing.  No significant comorbidities.  Normal vitals.  No fever.  Blood work ready collected prior to my evaluation with CBC BMP, urinalysis, chest x-ray and COVID test.  Per my review and interpretation of labs is no significant anemia or electrolyte abnormality or kidney injury.  Urinalysis is negative for infection.  Chest x-ray per my review and interpretation is no evidence of pneumonia.  COVID test is positive.  He is well-appearing.  Recommend Tylenol and ibuprofen.  Will give albuterol inhaler to use as needed.  Discharged in good condition.  This chart was dictated using voice recognition software.  Despite best efforts to proofread,  errors can occur which can change the documentation meaning.         Final Clinical Impression(s) / ED Diagnoses Final diagnoses:  [id number]    Rx / DC Orders ED Discharge Orders     None         Lennice Sites, DO 11/01/22 [phone]

## 2023-09-28 ENCOUNTER — Other Ambulatory Visit: Payer: Self-pay

## 2023-09-28 ENCOUNTER — Encounter (HOSPITAL_COMMUNITY): Payer: Self-pay

## 2023-09-28 ENCOUNTER — Emergency Department (HOSPITAL_COMMUNITY)
Admission: EM | Admit: 2023-09-28 | Discharge: 2023-09-29 | Disposition: A | Discharge status: Home / Self Care | Payer: Medicaid Other | Attending: Emergency Medicine | Admitting: Emergency Medicine

## 2023-09-28 ENCOUNTER — Emergency Department (HOSPITAL_COMMUNITY): Payer: Medicaid Other

## 2023-09-28 DIAGNOSIS — M79672 Pain in left foot: Secondary | ICD-10-CM | POA: Insufficient documentation

## 2023-09-28 DIAGNOSIS — M25572 Pain in left ankle and joints of left foot: Secondary | ICD-10-CM | POA: Insufficient documentation

## 2023-09-28 NOTE — ED Triage Notes (Signed)
Pt reports with left ankle pain x 2 days. Pt states that it is bigger than the other ankle and hurts when he goes to get up.

## 2023-09-29 ENCOUNTER — Emergency Department (HOSPITAL_COMMUNITY): Payer: Medicaid Other

## 2023-09-29 ENCOUNTER — Emergency Department (HOSPITAL_BASED_OUTPATIENT_CLINIC_OR_DEPARTMENT_OTHER)
Admission: RE | Admit: 2023-09-29 | Discharge: 2023-09-29 | Disposition: A | Discharge status: Home / Self Care | Payer: Medicaid Other | Source: Ambulatory Visit | Attending: Emergency Medicine | Admitting: Emergency Medicine

## 2023-09-29 DIAGNOSIS — M7989 Other specified soft tissue disorders: Secondary | ICD-10-CM | POA: Diagnosis not present

## 2023-09-29 MED ORDER — IBUPROFEN 800 MG PO TABS
800.0000 mg | ORAL_TABLET | Freq: Once | ORAL | Status: AC
  Administered 2023-09-29: 800 mg via ORAL
  Filled 2023-09-29: qty 1

## 2023-09-29 MED ORDER — NAPROXEN 500 MG PO TABS: 500.0000 mg | ORAL_TABLET | Freq: Two times a day (BID) | ORAL | 0 refills | Status: DC | PRN

## 2023-09-29 NOTE — ED Provider Notes (Signed)
[place] EMERGENCY DEPARTMENT AT [facility] Provider Note   CSN: [id number] Arrival date & time: 09/28/23  2145     History  Chief Complaint  Patient presents with   Ankle Pain    Joseph Boone is a 27 y.o. male.  Left ankle pain for 2 days and foot pain.  Denies any injury.  Pain with weightbearing.  States he works on his feet all day at [employer].  He took some oxycodone at home without relief which he had leftover from a previous injury.  Denies fevers, chills, nausea, vomiting.  No history of joint pain or other issues.  No chronic medical conditions.  No weakness, numbness or tingling.  Most of his pain is to his dorsal foot as well as medial and lateral ankle.  No hip or knee pain. Did have previous pain to his right ankle that felt similar but resolved on its own.  The history is provided by the patient.  Ankle Pain Associated symptoms: no fever        Home Medications Prior to Admission medications   Medication Sig Start Date End Date Taking? Authorizing Provider  amoxicillin (AMOXIL) 500 MG capsule Take 1 capsule (500 mg total) 3 (three) times daily by mouth. 08/31/17   Elson Areas, PA-C  ibuprofen (ADVIL,MOTRIN) 200 MG tablet Take 400 mg by mouth every 6 (six) hours as needed for mild pain.    [provider]  lidocaine (LIDODERM) 5 % Place 1 patch onto the skin daily. Remove & Discard patch within 12 hours or as directed by MD Patient not taking: Reported on 08/31/2017 08/01/17   Antony Madura, PA-C  methocarbamol (ROBAXIN) 500 MG tablet Take 1 tablet (500 mg total) by mouth 2 (two) times daily. 08/01/17   Antony Madura, PA-C  naproxen (NAPROSYN) 500 MG tablet Take 1 tablet (500 mg total) by mouth 2 (two) times daily. Patient not taking: Reported on 08/31/2017 06/04/16   [name], NP  predniSONE (DELTASONE) 20 MG tablet Take 2 tablets (40 mg total) by mouth daily. Take 40 mg by mouth daily for 3 days, then 20mg  by mouth daily for 3 days,  then 10mg  daily for 3 days Patient not taking: Reported on 08/31/2017 08/01/17   Antony Madura, PA-C      Allergies    Other, Pollen extract, and Tylenol [acetaminophen]    Review of Systems   Review of Systems  Constitutional:  Negative for activity change, appetite change and fever.  HENT:  Negative for congestion.   Eyes:  Negative for visual disturbance.  Respiratory:  Negative for cough, chest tightness and shortness of breath.   Cardiovascular:  Negative for chest pain.  Gastrointestinal:  Negative for abdominal pain, nausea and vomiting.  Genitourinary:  Negative for dysuria and hematuria.  Musculoskeletal:  Positive for arthralgias and myalgias.  Neurological:  Negative for dizziness, weakness and headaches.   all other systems are negative except as noted in the HPI and PMH.    Physical Exam Updated Vital Signs BP (!) 145/101 (BP Location: Right Arm)   Pulse 95   Temp 99.7 F (37.6 C) (Oral)   Resp 20   Ht 5\' 11"  (1.803 m)   Wt (!) 145.2 kg   SpO2 95%   BMI 44.63 kg/m  Physical Exam Vitals and nursing note reviewed.  Constitutional:      General: He is not in acute distress.    Appearance: He is well-developed. He is not ill-appearing.  HENT:  Head: Normocephalic and atraumatic.     Mouth/Throat:     Pharynx: No oropharyngeal exudate.  Eyes:     Conjunctiva/sclera: Conjunctivae normal.     Pupils: Pupils are equal, round, and reactive to light.  Neck:     Comments: No meningismus. Cardiovascular:     Rate and Rhythm: Normal rate and regular rhythm.     Heart sounds: Normal heart sounds. No murmur heard. Pulmonary:     Effort: Pulmonary effort is normal. No respiratory distress.     Breath sounds: Normal breath sounds.  Abdominal:     Palpations: Abdomen is soft.     Tenderness: There is no abdominal tenderness. There is no guarding or rebound.  Musculoskeletal:        General: Swelling and tenderness present.     Cervical back: Normal range of motion  and neck supple.     Comments: Diffuse tenderness and swelling to L ankle.  Tender both medial and lateral malleoli.  Intact DP and PT pulse.  Intact Achilles function.  No pain at base of fifth metatarsal.  No proximal fibular tenderness. No asymmetric calf swelling.  Skin:    General: Skin is warm.  Neurological:     Mental Status: He is alert and oriented to person, place, and time.     Cranial Nerves: No cranial nerve deficit.     Motor: No abnormal muscle tone.     Coordination: Coordination normal.     Comments:  5/5 strength throughout. CN 2-12 intact.Equal grip strength.   Psychiatric:        Behavior: Behavior normal.     ED Results / Procedures / Treatments   Labs (all labs ordered are listed, but only abnormal results are displayed) Labs Reviewed - No data to display  EKG None  Radiology DG Ankle Complete Left  Result Date: 09/28/2023 CLINICAL DATA:  Swelling and pain EXAM: LEFT ANKLE COMPLETE - 3+ VIEW COMPARISON:  06/04/2016 FINDINGS: No fracture or malalignment. IMPRESSION: Negative. Electronically Signed   By: Jasmine Pang M.D.   On: 09/28/2023 23:41    Procedures Procedures    Medications Ordered in ED Medications  ibuprofen (ADVIL) tablet 800 mg (has no administration in time range)    ED Course/ Medical Decision Making/ A&P                                 Medical Decision Making Amount and/or Complexity of Data Reviewed Labs: ordered. Decision-making details documented in ED Course. Radiology: ordered and independent interpretation performed. Decision-making details documented in ED Course. ECG/medicine tests: ordered and independent interpretation performed. Decision-making details documented in ED Course.  Risk Prescription drug management.  2 days of L ankle pain and swelling. No trauma.  Intact DP and PT pulses.  No breaks in skin.  No calf asymmetry  No proximal fibular tenderness.  X-ray obtained in triage is negative for  fracture. Results reviewed and interpreted by me  X-ray of left ankle and foot are negative for fracture or dislocation.  Results reviewed interpreted by me.  Neurovascularly intact.  Will treat supportively with anti-inflammatories, ASO and crutches.  Will arrange for outpatient Doppler ultrasound to rule out DVT though this is not favored.  Return precautions discussed No hypoxia, increased work of breathing, chest pain or shortness of breath        Final Clinical Impression(s) / ED Diagnoses Final diagnoses:  None    Rx /  DC Orders ED Discharge Orders     None         Jadarion Halbig, Jeannett Senior, MD 09/29/23 0145

## 2023-09-29 NOTE — Discharge Instructions (Signed)
X-rays negative for fracture or dislocation.  Take the anti-inflammatories as prescribed and follow-up with your doctor.  Return tomorrow for ultrasound of the leg to rule out blood clot.  Return to the ED with new or worsening symptoms.

## 2023-11-07 ENCOUNTER — Other Ambulatory Visit: Payer: Self-pay

## 2023-11-07 ENCOUNTER — Ambulatory Visit: Payer: Self-pay | Attending: Family Medicine

## 2023-11-07 DIAGNOSIS — M6281 Muscle weakness (generalized): Secondary | ICD-10-CM | POA: Insufficient documentation

## 2023-11-07 DIAGNOSIS — M25572 Pain in left ankle and joints of left foot: Secondary | ICD-10-CM | POA: Insufficient documentation

## 2023-11-07 DIAGNOSIS — M5459 Other low back pain: Secondary | ICD-10-CM | POA: Insufficient documentation

## 2023-11-07 DIAGNOSIS — R2689 Other abnormalities of gait and mobility: Secondary | ICD-10-CM | POA: Insufficient documentation

## 2023-11-07 NOTE — Therapy (Signed)
 OUTPATIENT PHYSICAL THERAPY THORACOLUMBAR EVALUATION   Patient Name: Joseph Boone MRN: [id number] DOB:03-17-1996, 28 y.o., male Today's Date: 11/07/2023  END OF SESSION:  PT End of Session - 11/07/23 1241     Visit Number 1    Number of Visits 17    Date for PT Re-Evaluation 01/02/24    Authorization Type [place] [facility] [employer]    Authorization Time Period initial auth submitted    PT Start Time 1100    PT Stop Time 1144    PT Time Calculation (min) 44 min    Activity Tolerance Patient tolerated treatment well    Behavior During Therapy WFL for tasks assessed/performed             Past Medical History:  Diagnosis Date   Obesity    History reviewed. No pertinent surgical history. 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    PCP:  Alec House, MD  REFERRING PROVIDER: Alec House, MD  REFERRING DIAG: M54.6 - Left Lumbar Radiculopathy  Rationale for Evaluation and Treatment: Rehabilitation  THERAPY DIAG:  Other low back pain  Muscle weakness (generalized)  Other abnormalities of gait and mobility  Pain in left ankle and joints of left foot  ONSET DATE: Chronic  SUBJECTIVE:                                                                                                                                                                                           SUBJECTIVE STATEMENT: Pt presents to PT with reports of chronic LBP with referral into L LE. Also notes chronic bilateral ankle pain especially with prolonged standing at work. Stairs have particularly been tough for him and he notes that he has had some instances of L knee buckling. Denies bowel/bladder changes or saddle anesthesia.  PERTINENT HISTORY:  See PMH  PAIN:  Are you having pain?  Yes: NPRS scale: 0/10 Worst: 10/10 Pain location: lower  back, posterior L LE  Pain description: sharp, N/T Aggravating factors: prolonged standing at work, stairs Relieving factors: medication  PRECAUTIONS: None  RED FLAGS: None   WEIGHT BEARING RESTRICTIONS: No  FALLS:  Has patient fallen in last 6 months? Yes. 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  DIAGNOSTIC FINDINGS:  See imaging   PATIENT SURVEYS:  FOTO: 47% function; 64% predicted  COGNITION: Overall cognitive status: Within functional limits for tasks assessed     SENSATION: WFL  MUSCLE LENGTH: Hamstrings: Tightness in both, L tighter than R.  Thomas test: DNT  POSTURE: rounded shoulders, forward head, and increased lumbar lordosis  PALPATION: TTP to L lumbar paraspinals, L lateral hip  LUMBAR ROM:   AROM eval  Flexion 75%  Extension 50%  Right lateral flexion   Left lateral flexion   Right rotation   Left rotation    (Blank rows = not tested)  LOWER EXTREMITY ROM:     Active  Right eval Left eval  Hip flexion    Hip extension    Hip abduction    Hip adduction    Hip internal rotation    Hip external rotation    Knee flexion    Knee extension    Ankle dorsiflexion 11 5  Ankle plantarflexion    Ankle inversion    Ankle eversion     (Blank rows = not tested)  LOWER EXTREMITY MMT:    MMT Right eval Left eval  Hip flexion 5 4   Hip extension    Hip abduction 5 3+  Hip adduction    Hip internal rotation    Hip external rotation    Knee  flexion    Knee extension 5 5  Ankle dorsiflexion 5 5 P!  Ankle plantarflexion 5 5 P!  Ankle inversion    Ankle eversion     (Blank rows = not tested)  LUMBAR SPECIAL TESTS:  Straight leg raise test: Positive and Slump test: Positive  FUNCTIONAL TESTS:  30 Second Sit to Stand:  7 reps with use of hands.   GAIT: Distance walked: 40 ft Assistive device utilized: None Level of assistance: Complete Independence Comments: decreased heel strike L, out-toeing   TREATMENT: [facility] Adult PT Treatment:                                                DATE: 11/07/2023 Therapeutic Exercise: Seated sciatic nerve glide x 10 L Supine PPT x 5 - 5hold LTR x 5 each Supine hamstring stretch x 30 L Longsitting calf stretch x 30 L  PATIENT EDUCATION:  Education details: eval findings, FOTO, HEP, POC Person educated: Patient Education method: Explanation, Demonstration, and Handouts Education comprehension: verbalized understanding and returned demonstration  HOME EXERCISE PROGRAM: Access Code: [email or web address] URL: [email or web address] Date: 11/07/2023 Prepared by: Alm Kingdom  Exercises - Seated Sciatic Tensioner  - 1 x daily - 7 x weekly - 2 sets - 10 reps - Supine Posterior Pelvic Tilt  - 1 x daily - 7 x weekly - 2 sets - 10 reps - Supine Lower Trunk Rotation  - 1 x daily - 7 x weekly - 2 sets - 10 reps - Sidelying Hip Abduction  - 1 x daily - 7 x weekly - 2-3 sets - 10 reps - Supine Hamstring Stretch with Strap  - 1 x daily - 7 x weekly - 2 reps - 30 sec hold - Long Sitting Calf Stretch with  Strap  - 1 x daily - 7 x weekly - 2 reps - 30 sec hold  ASSESSMENT:  CLINICAL IMPRESSION: Patient is a 28 y.o. F who was seen today for physical therapy evaluation and treatment for acute on chronic LBP with referral into L LE. Physical findings are consistent with referring provider impression as pt demonstrates decrease in hip/core strength and functional mobility. FOTO score  demonstrates decrease in subjective functional ability below PLOF. Pt would benefit from skilled PT services working on improving strength and mobility in order to decrease pain and improve function.    OBJECTIVE IMPAIRMENTS: Abnormal gait, decreased activity tolerance, decreased endurance, decreased mobility, difficulty walking, decreased ROM, decreased strength, and pain.   ACTIVITY LIMITATIONS: carrying, lifting, sitting, standing, squatting, stairs, and transfers  PARTICIPATION LIMITATIONS: meal prep, cleaning, driving, shopping, community activity, occupation, and yard work  PERSONAL FACTORS: Time since onset of injury/illness/exacerbation are also affecting patient's functional outcome.   REHAB POTENTIAL: Excellent  CLINICAL DECISION MAKING: Stable/uncomplicated  EVALUATION COMPLEXITY: Low   GOALS: Goals reviewed with patient? No  SHORT TERM GOALS: Target date: 11/28/2023   Pt will be compliant and knowledgeable with initial HEP for improved comfort and carryover Baseline: initial HEP given  Goal status: INITIAL  2.  Pt will self report lower back and L LE pain no greater than 6/10 for improved comfort and functional ability Baseline: 10/10 at worst Goal status: INITIAL   LONG TERM GOALS: Target date: 01/02/2024   Pt will improve FOTO function score to no less than 64% as proxy for functional improvement Baseline: 47% function Goal status: INITIAL   2.  Pt will self report lower back and L LE pain no greater than 3/10 for improved comfort and functional ability Baseline: 10/10 at worst Goal status: INITIAL   3.  Pt will increase 30 Second Sit to Stand rep count to no less than 10 reps for improved balance, strength, and functional mobility Baseline: 7 reps Goal status: INITIAL   4.  Pt will improve bilateral LE MMT to no less than 5/5 for improved functional mobility and decrease pain Baseline: see MMT chart Goal status: INITIAL  5.  Pt will improve L ankle DF to no  less than 10 degrees for improved mobility and decreased pain Baseline: 5 degrees Goal status: INITIAL  PLAN:  PT FREQUENCY: 1-2x/week  PT DURATION: 8 weeks  PLANNED INTERVENTIONS: 97164- PT Re-evaluation, 97110-Therapeutic exercises, 97530- Therapeutic activity, 97112- Neuromuscular re-education, 97535- Self Care, [id number]- Manual therapy, [id number]- Gait training, 97014- Electrical stimulation (unattended), [id number]- Electrical stimulation (manual), and 97016- Vasopneumatic device.  PLAN FOR NEXT SESSION: assess HEP response, assess positional preference, core/hip strengthening, LE stretching   [employer] Authorization   Choose one: Rehabilitative  Standardized Assessment or Functional Outcome Tool: FOTO  Score or Percent Disability: 47% function; 64% predicted  Body Parts Treated (Select each separately):  Lumbopelvic. Overall deficits/functional limitations for body part selected: moderate Ankle. Overall deficits/functional limitations for body part selected: moderate N/A. Overall deficits/functional limitations for body part selected: N/A   If treatment provided at initial evaluation, no treatment charged due to lack of authorization.     Alm JAYSON Kingdom, PT 11/07/2023, 1:32 PM

## 2023-11-15 ENCOUNTER — Ambulatory Visit: Payer: Self-pay | Admitting: Physical Therapy

## 2023-11-17 ENCOUNTER — Ambulatory Visit: Payer: Self-pay | Admitting: Physical Therapy

## 2023-11-22 ENCOUNTER — Ambulatory Visit: Payer: Self-pay | Admitting: Physical Therapy

## 2023-11-24 ENCOUNTER — Ambulatory Visit: Payer: Self-pay | Admitting: Physical Therapy

## 2023-11-29 ENCOUNTER — Encounter: Payer: Medicaid Other | Admitting: Physical Therapy

## 2023-12-01 ENCOUNTER — Ambulatory Visit: Payer: Self-pay | Admitting: Physical Therapy

## 2023-12-06 ENCOUNTER — Encounter: Payer: Medicaid Other | Admitting: Physical Therapy

## 2023-12-08 ENCOUNTER — Encounter: Payer: Medicaid Other | Admitting: Physical Therapy

## 2024-05-04 ENCOUNTER — Encounter (HOSPITAL_COMMUNITY): Payer: Self-pay

## 2024-05-04 ENCOUNTER — Ambulatory Visit (HOSPITAL_COMMUNITY)
Admission: EM | Admit: 2024-05-04 | Discharge: 2024-05-04 | Disposition: A | Discharge status: Home / Self Care | Attending: Emergency Medicine | Admitting: Emergency Medicine

## 2024-05-04 DIAGNOSIS — M25572 Pain in left ankle and joints of left foot: Secondary | ICD-10-CM

## 2024-05-04 DIAGNOSIS — M25571 Pain in right ankle and joints of right foot: Secondary | ICD-10-CM

## 2024-05-04 DIAGNOSIS — R609 Edema, unspecified: Secondary | ICD-10-CM | POA: Diagnosis not present

## 2024-05-04 MED ORDER — MELOXICAM 15 MG PO TABS: 15.0000 mg | ORAL_TABLET | Freq: Every day | ORAL | 0 refills | Status: AC

## 2024-05-04 NOTE — ED Triage Notes (Signed)
 Patient presenting with bilateral ankle pain but worse in the left ankle onset last year with a new injury but worse today. Patient states now having trouble with ambulation, pain is worse when he has sat for a while and then goes to stand. Patient states he had an accident where a pallet fell on him and injured the ankles. Now having pain in the feet as well.  Prescriptions or OTC medications tried: Yes- Ibuprofen  and tylenol     with no relief

## 2024-05-04 NOTE — ED Provider Notes (Signed)
 [facility]    CSN: [id number] Arrival date & time: 05/04/24  1717      History   Chief Complaint Chief Complaint  Patient presents with   Ankle Pain    HPI Joseph Boone is a 28 y.o. male.   Patient presents to clinic over concern of bilateral foot and ankle pain and swelling.  Feels like his right ankle and foot are more swollen and more painful.  Did have an injury where a pallet fell on him a few years back and since then he has had ongoing ankle pain.  Reports pain will be severe when going from sitting to standing and sometimes he will have to sit back down and wait a little bit to try again to stand up.  Works at [employer] he is constantly standing for work.  Has tried compression stockings, got an extra-large and they are very tight.  Has not had any true trauma or injuries.  Did recently get shoe inserts.  Has been taking ibuprofen  at home.  The history is provided by the patient and medical records.  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 No pertinent surgical history.     Home Medications    Prior to Admission medications   Medication Sig Start Date End Date Taking? Authorizing Provider  meloxicam  (MOBIC ) 15 MG tablet Take 1 tablet (15 mg total) by mouth daily. 05/04/24  Yes Dreama, Davyn Morandi  N, FNP    Family History History reviewed. No pertinent family history.  Social History Social History   Tobacco Use   Smoking status: Never   Smokeless tobacco: Never  Substance Use Topics   Alcohol use: No   Drug use: No     Allergies   Other, Pollen extract, and Tylenol  [acetaminophen ]   Review of Systems Review of Systems  Per HPI  Physical Exam Triage Vital Signs ED Triage Vitals   Encounter Vitals Group     BP 05/04/24 1729 124/77     Girls Systolic BP Percentile --      Girls Diastolic BP Percentile --      Boys Systolic BP Percentile --      Boys Diastolic BP Percentile --      Pulse Rate 05/04/24 1729 86     Resp 05/04/24 1729 18     Temp 05/04/24 1729 98.2 F (36.8 C)     Temp Source 05/04/24 1729 Oral     SpO2 05/04/24 1729 97 %     Weight 05/04/24 1729 (!) 320 lb (145.2 kg)     Height 05/04/24 1729 5' 11 (1.803 m)     Head Circumference --      Peak Flow --      Pain Score 05/04/24 1728 10     Pain Loc --      Pain Education --      Exclude from Growth Chart --    No data found.  Updated Vital Signs BP 124/77 (BP Location: Left Arm)   Pulse 86   Temp 98.2 F (36.8 C) (Oral)   Resp 18   Ht 5' 11 (1.803 m)   Wt (!) 320 lb (145.2 kg)   SpO2 97%   BMI 44.63 kg/m  Visual Acuity Right Eye Distance:   Left Eye Distance:   Bilateral Distance:    Right Eye Near:   Left Eye Near:    Bilateral Near:     Physical Exam Vitals and nursing note reviewed.  Constitutional:      Appearance: Normal appearance.  HENT:     Head: Normocephalic and atraumatic.     Right Ear: External ear normal.     Left Ear: External ear normal.     Nose: Nose normal.     Mouth/Throat:     Mouth: Mucous membranes are moist.  Eyes:     Conjunctiva/sclera: Conjunctivae normal.  Cardiovascular:     Rate and Rhythm: Normal rate.     Pulses: Normal pulses.          Dorsalis pedis pulses are 2+ on the right side and 2+ on the left side.       Posterior tibial pulses are 2+ on the right side and 2+ on the left side.  Pulmonary:     Effort: Pulmonary effort is normal. No respiratory distress.  Musculoskeletal:        General: Normal range of motion.     Right lower leg: 1+ Edema present.     Left lower leg: 1+ Edema present.  Skin:    General: Skin is warm and dry.     Capillary Refill: Capillary refill takes less than 2 seconds.  Neurological:     General:  No focal deficit present.     Mental Status: He is alert and oriented to person, place, and time.  Psychiatric:        Mood and Affect: Mood normal.        Behavior: Behavior normal. Behavior is cooperative.      UC Treatments / Results  Labs (all labs ordered are listed, but only abnormal results are displayed) Labs Reviewed - No data to display  EKG   Radiology No results found.  Procedures Procedures (including critical care time)  Medications Ordered in UC Medications - No data to display  Initial Impression / Assessment and Plan / UC Course  I have reviewed the triage vital signs and the nursing notes.  Pertinent labs & imaging results that were available during my care of the patient were reviewed by me and considered in my medical decision making (see chart for details).  Vitals and triage reviewed, patient is hemodynamically stable.  Bilateral foot and ankle 1+ nonpitting edema.  Atraumatic, imaging deferred.  Ambulatory.  Suspect dependent edema, symptomatic management discussed.  Plan of care, follow-up care return precautions given, no questions at this time.    Final Clinical Impressions(s) / UC Diagnoses   Final diagnoses:  Dependent edema  Bilateral ankle pain, unspecified chronicity     Discharge Instructions      You have swelling in both your ankles and feet.  Please rest and elevate your feet.  You can take warm Epsom salt baths to see if this helps with the pain and swelling.  Take the meloxicam  or Mobic  by mouth daily.  Do not take this with any naproxen  or ibuprofen  as these are both NSAIDs.  Ensure you are moving your feet and ankles frequently at work to help prevent venous stasis.  Wear proper fitting compression stockings to help aid with swelling.  If you continue to have symptoms despite these interventions please follow-up with sports medicine.  It is important that you get established with a primary care provider for ongoing monitoring  as  well.  Return to clinic for any new or urgent symptoms.     ED Prescriptions     Medication Sig Dispense Auth. Provider   meloxicam  (MOBIC ) 15 MG tablet Take 1 tablet (15 mg total) by mouth daily. 30 tablet Dreama, Joe Tanney  N, FNP      PDMP not reviewed this encounter.   Dreama Marvelyn Bouchillon  N, FNP 05/04/24 1752

## 2024-05-04 NOTE — Discharge Instructions (Addendum)
 You have swelling in both your ankles and feet.  Please rest and elevate your feet.  You can take warm Epsom salt baths to see if this helps with the pain and swelling.  Take the meloxicam  or Mobic  by mouth daily.  Do not take this with any naproxen  or ibuprofen  as these are both NSAIDs.  Ensure you are moving your feet and ankles frequently at work to help prevent venous stasis.  Wear proper fitting compression stockings to help aid with swelling.  If you continue to have symptoms despite these interventions please follow-up with sports medicine.  It is important that you get established with a primary care provider for ongoing monitoring as well.  Return to clinic for any new or urgent symptoms.

## 2024-05-21 ENCOUNTER — Ambulatory Visit: Admitting: Family Medicine
